# Patient Record
Sex: Female | Born: 1980 | Race: White | Hispanic: No | Marital: Single | Smoking: Current every day smoker
Health system: Southern US, Community
[De-identification: ages and names within clinical notes are randomized; demographics above are authoritative.]

---

## 2014-05-02 ENCOUNTER — Emergency Department (HOSPITAL_COMMUNITY)
Admission: EM | Admit: 2014-05-02 | Discharge: 2014-05-02 | Disposition: A | Discharge status: Home / Self Care | Payer: Self-pay | Attending: Emergency Medicine | Admitting: Emergency Medicine

## 2014-05-02 DIAGNOSIS — N898 Other specified noninflammatory disorders of vagina: Secondary | ICD-10-CM | POA: Insufficient documentation

## 2014-05-02 DIAGNOSIS — Z79899 Other long term (current) drug therapy: Secondary | ICD-10-CM | POA: Insufficient documentation

## 2014-05-02 DIAGNOSIS — R51 Headache: Secondary | ICD-10-CM | POA: Insufficient documentation

## 2014-05-02 DIAGNOSIS — Z3202 Encounter for pregnancy test, result negative: Secondary | ICD-10-CM | POA: Insufficient documentation

## 2014-05-02 DIAGNOSIS — Q613 Polycystic kidney, unspecified: Secondary | ICD-10-CM | POA: Insufficient documentation

## 2014-05-02 DIAGNOSIS — N390 Urinary tract infection, site not specified: Secondary | ICD-10-CM | POA: Insufficient documentation

## 2014-05-02 DIAGNOSIS — Z792 Long term (current) use of antibiotics: Secondary | ICD-10-CM | POA: Insufficient documentation

## 2014-05-02 LAB — URINALYSIS, ROUTINE W REFLEX MICROSCOPIC
Bilirubin Urine: NEGATIVE
Glucose, UA: NEGATIVE mg/dL
KETONES UR: NEGATIVE mg/dL
NITRITE: POSITIVE — AB
PROTEIN: NEGATIVE mg/dL
Specific Gravity, Urine: 1.021 (ref 1.005–1.030)
UROBILINOGEN UA: 0.2 mg/dL (ref 0.0–1.0)
pH: 6 (ref 5.0–8.0)

## 2014-05-02 LAB — CBC WITH DIFFERENTIAL/PLATELET
Basophils Absolute: 0 10*3/uL (ref 0.0–0.1)
Basophils Relative: 0 % (ref 0–1)
Eosinophils Absolute: 0.2 10*3/uL (ref 0.0–0.7)
Eosinophils Relative: 2 % (ref 0–5)
HCT: 36 % (ref 36.0–46.0)
Hemoglobin: 12.3 g/dL (ref 12.0–15.0)
Lymphocytes Relative: 22 % (ref 12–46)
Lymphs Abs: 2 10*3/uL (ref 0.7–4.0)
MCH: 31.1 pg (ref 26.0–34.0)
MCHC: 34.2 g/dL (ref 30.0–36.0)
MCV: 90.9 fL (ref 78.0–100.0)
Monocytes Absolute: 0.5 10*3/uL (ref 0.1–1.0)
Monocytes Relative: 6 % (ref 3–12)
Neutro Abs: 6.3 10*3/uL (ref 1.7–7.7)
Neutrophils Relative %: 70 % (ref 43–77)
Platelets: 284 10*3/uL (ref 150–400)
RBC: 3.96 MIL/uL (ref 3.87–5.11)
RDW: 13 % (ref 11.5–15.5)
WBC: 9.1 10*3/uL (ref 4.0–10.5)

## 2014-05-02 LAB — COMPREHENSIVE METABOLIC PANEL
ALT: 9 U/L (ref 0–35)
AST: 13 U/L (ref 0–37)
Albumin: 3.7 g/dL (ref 3.5–5.2)
Alkaline Phosphatase: 58 U/L (ref 39–117)
BUN: 9 mg/dL (ref 6–23)
CO2: 26 mEq/L (ref 19–32)
Calcium: 9.4 mg/dL (ref 8.4–10.5)
Chloride: 103 mEq/L (ref 96–112)
Creatinine, Ser: 0.69 mg/dL (ref 0.50–1.10)
GFR calc Af Amer: >90 mL/min (ref >90)
GFR calc non Af Amer: >90 mL/min (ref >90)
Glucose, Bld: 82 mg/dL (ref 70–99)
Potassium: 3.8 mEq/L (ref 3.7–5.3)
Sodium: 141 mEq/L (ref 137–147)
Total Bilirubin: <0.2 mg/dL — ABNORMAL LOW (ref 0.3–1.2)
Total Protein: 6.5 g/dL (ref 6.0–8.3)

## 2014-05-02 LAB — URINE MICROSCOPIC-ADD ON

## 2014-05-02 LAB — POC URINE PREG, ED: Preg Test, Ur: NEGATIVE

## 2014-05-02 MED ORDER — OXYCODONE-ACETAMINOPHEN 5-325 MG PO TABS: 2.0000 | ORAL_TABLET | Freq: Once | ORAL | Status: DC

## 2014-05-02 MED ORDER — OXYCODONE-ACETAMINOPHEN 5-325 MG PO TABS
1.0000 | ORAL_TABLET | Freq: Once | ORAL | Status: AC
  Administered 2014-05-02: 1 via ORAL
  Filled 2014-05-02: qty 1

## 2014-05-02 MED ORDER — IBUPROFEN 600 MG PO TABS: 600.0000 mg | ORAL_TABLET | Freq: Four times a day (QID) | ORAL | Status: DC | PRN

## 2014-05-02 MED ORDER — CIPROFLOXACIN HCL 500 MG PO TABS: 500.0000 mg | ORAL_TABLET | Freq: Two times a day (BID) | ORAL | Status: DC

## 2014-05-02 MED ORDER — OXYCODONE-ACETAMINOPHEN 5-325 MG PO TABS: ORAL_TABLET | ORAL | Status: DC

## 2014-05-02 NOTE — ED Notes (Signed)
Pt with hx of PKD. Pt reports UTI and blood in urine.

## 2014-05-02 NOTE — Discharge Planning (Signed)
[facility] Community Liaison  Spoke to patient about community and primary care resources. Patient states she is new to the area and has no insurance at this time. Patient was given the orange card application and instructions on where to take the completed application. My contact information was provided for any future questions or concerns. No other needs expressed at this time.

## 2014-05-02 NOTE — ED Notes (Signed)
Pt called out requesting pain medicine 

## 2014-05-02 NOTE — ED Provider Notes (Signed)
CSN: [id number]     Arrival date & time 05/02/14  1409 History   First MD Initiated Contact with Patient 05/02/14 1430    This chart was scribed for non-physician practitioner, Junius FinnerErin O'Malley, working with Glynn OctaveStephen Rancour, MD by Marica OtterNusrat Rahman, ED Scribe. This patient was seen in room TR10C/TR10C and the patient's care was started at 3:32 PM.  Chief Complaint  Patient presents with  . Urinary Tract Infection   The history is provided by the patient. No language interpreter was used.   HPI Comments: Melanie Braun is a 33 y.o. female, with a Hx of PKD and UTIs (approximately once a month), who presents to the Emergency Department complaining of blood in her urine and associated dysuria. Pt also complains of associated: acute, stabbing left flank pain which she rates a 8 out of 10; acute, burning bladder pain, which she rates a 9 out of 10; elevated BP; and HA. Specifically, pt reports a couple of weeks ago she was very ill with fever, nausea and vomiting, which has now resolved. However, pt notes that since then her BP has been elevated although pt is unable to give specific readings. Pt's current BP is 112/96; pt notes her BP on average "sits low" and is around 95/55. Pt reports her last menstrual cycle was last week. Pt denies vaginal discharge. Pt reports historically 7 day Cipro has been effective combating her UTI Sx. Pt denies any present nausea, vomiting, fever.    No past medical history on file. No past surgical history on file. No family history on file. History  Substance Use Topics  . Smoking status: Not on file  . Smokeless tobacco: Not on file  . Alcohol Use: Not on file   OB History   No data available     Review of Systems  Constitutional: Negative for fever.  Gastrointestinal: Negative for nausea and vomiting.  Genitourinary: Positive for dysuria, flank pain and vaginal bleeding.  Musculoskeletal:          Neurological: Positive for headaches.  All other systems  reviewed and are negative.     Allergies  Bactrim and Bee venom  Home Medications   Prior to Admission medications   Medication Sig Start Date End Date Taking? Authorizing Provider  CRANBERRY PO Take 1 tablet by mouth every evening.   Yes Historical Provider, MD  EPINEPHrine (EPIPEN) 0.3 mg/0.3 mL IJ SOAJ injection Inject 0.3 mg into the muscle as needed (allergic reaction).   Yes Historical Provider, MD  Multiple Vitamin (MULTIVITAMIN WITH MINERALS) TABS tablet Take 1 tablet by mouth every evening.   Yes Historical Provider, MD  OVER THE COUNTER MEDICATION Take 1 capsule by mouth every evening. Kidney k   Yes Historical Provider, MD  ciprofloxacin (CIPRO) 500 MG tablet Take 1 tablet (500 mg total) by mouth 2 (two) times daily. 05/02/14   Junius FinnerErin O'Malley, PA-C  ibuprofen (ADVIL,MOTRIN) 600 MG tablet Take 1 tablet (600 mg total) by mouth every 6 (six) hours as needed. 05/02/14   Junius FinnerErin O'Malley, PA-C  oxyCODONE-acetaminophen (PERCOCET/ROXICET) 5-325 MG per tablet Take 1-2 pills every 4-6 hours as needed for pain. 05/02/14   Junius FinnerErin O'Malley, PA-C   Triage Vitals: BP 112/96  Pulse 90  Temp(Src) 97.8 F (36.6 C) (Oral)  SpO2 100% Physical Exam  Nursing note and vitals reviewed. Constitutional: She is oriented to person, place, and time. She appears well-developed and well-nourished.  Appears normal and non-toxic   HENT:  Head: Normocephalic and atraumatic.  Eyes: EOM are  normal.  Neck: Normal range of motion. Neck supple.  Cardiovascular: Normal rate, regular rhythm and normal heart sounds.   Pulmonary/Chest: Effort normal and breath sounds normal. No respiratory distress. She has no wheezes. She has no rales. She exhibits no tenderness.  Abdominal: Soft. She exhibits no distension and no mass. There is no tenderness. There is no rebound and no guarding.  Left CVA mild tenderness. Soft, non-distended, non-tender.  Musculoskeletal: Normal range of motion. She exhibits no edema.  Neurological:  She is alert and oriented to person, place, and time.  Skin: Skin is warm and dry.  Psychiatric: She has a normal mood and affect. Her behavior is normal.    ED Course  Procedures (including critical care time) DIAGNOSTIC STUDIES: Oxygen Saturation is 100% on ra, normal by my interpretation.    COORDINATION OF CARE: 3:37 PM-Discussed treatment plan which includes urology referral, with pt at bedside and pt agreed to plan.   Labs Review Labs Reviewed  URINALYSIS, ROUTINE W REFLEX MICROSCOPIC - Abnormal; Notable for the following:    APPearance CLOUDY (*)    Hgb urine dipstick TRACE (*)    Nitrite POSITIVE (*)    Leukocytes, UA SMALL (*)    All other components within normal limits  COMPREHENSIVE METABOLIC PANEL - Abnormal; Notable for the following:    Total Bilirubin <0.2 (*)    All other components within normal limits  URINE MICROSCOPIC-ADD ON - Abnormal; Notable for the following:    Squamous Epithelial / LPF FEW (*)    Bacteria, UA MANY (*)    All other components within normal limits  CBC WITH DIFFERENTIAL  POC URINE PREG, ED    Imaging Review No results found.   EKG Interpretation None      MDM   Final diagnoses:  UTI (lower urinary tract infection)    Pt with hx of PKD and recurrent UTIs presenting with concern for UTI. Pt also reports elevated BP, however Vitals: WNL, BP-112/96 in triage.  Pt appears well, non-toxic. UA: consistent for UTI.  Will tx.  CBC and CMP-unremarkable.  Advised to f/u with PCP and Dr. Mena Goes, urology as needed for ongoing healthcare needs and recurrent UTIs. Return precautions provided. Pt verbalized understanding and agreement with tx plan.   I personally performed the services described in this documentation, which was scribed in my presence. The recorded information has been reviewed and is accurate.    Junius Finner, PA-C 05/02/14 1651

## 2014-05-02 NOTE — ED Notes (Signed)
Called lab about status of blood work.  Per lab tech, blood not received.  Spoke with supervisor who stated blood was received but not in process.  Blood work currently in process.

## 2014-05-03 NOTE — ED Provider Notes (Signed)
Medical screening examination/treatment/procedure(s) were performed by non-physician practitioner and as supervising physician I was immediately available for consultation/collaboration.   EKG Interpretation None        Raylie Maddison, MD 05/03/14 0148 

## 2014-06-24 ENCOUNTER — Emergency Department (HOSPITAL_COMMUNITY): Payer: Self-pay

## 2014-06-24 ENCOUNTER — Emergency Department (HOSPITAL_COMMUNITY)
Admission: EM | Admit: 2014-06-24 | Discharge: 2014-06-24 | Disposition: A | Discharge status: Home / Self Care | Payer: Self-pay | Attending: Emergency Medicine | Admitting: Emergency Medicine

## 2014-06-24 ENCOUNTER — Encounter (HOSPITAL_COMMUNITY): Payer: Self-pay | Admitting: Emergency Medicine

## 2014-06-24 DIAGNOSIS — F17200 Nicotine dependence, unspecified, uncomplicated: Secondary | ICD-10-CM | POA: Insufficient documentation

## 2014-06-24 DIAGNOSIS — S1093XA Contusion of unspecified part of neck, initial encounter: Principal | ICD-10-CM

## 2014-06-24 DIAGNOSIS — S20229A Contusion of unspecified back wall of thorax, initial encounter: Secondary | ICD-10-CM | POA: Insufficient documentation

## 2014-06-24 DIAGNOSIS — S300XXA Contusion of lower back and pelvis, initial encounter: Secondary | ICD-10-CM | POA: Insufficient documentation

## 2014-06-24 DIAGNOSIS — Z791 Long term (current) use of non-steroidal anti-inflammatories (NSAID): Secondary | ICD-10-CM | POA: Insufficient documentation

## 2014-06-24 DIAGNOSIS — R51 Headache: Secondary | ICD-10-CM

## 2014-06-24 DIAGNOSIS — S0003XA Contusion of scalp, initial encounter: Secondary | ICD-10-CM | POA: Insufficient documentation

## 2014-06-24 DIAGNOSIS — R519 Headache, unspecified: Secondary | ICD-10-CM

## 2014-06-24 DIAGNOSIS — S0083XA Contusion of other part of head, initial encounter: Secondary | ICD-10-CM | POA: Insufficient documentation

## 2014-06-24 DIAGNOSIS — S0990XA Unspecified injury of head, initial encounter: Secondary | ICD-10-CM | POA: Insufficient documentation

## 2014-06-24 MED ORDER — TETANUS-DIPHTH-ACELL PERTUSSIS 5-2.5-18.5 LF-MCG/0.5 IM SUSP
0.5000 mL | Freq: Once | INTRAMUSCULAR | Status: DC
  Filled 2014-06-24: qty 0.5

## 2014-06-24 MED ORDER — KETOROLAC TROMETHAMINE 60 MG/2ML IM SOLN
60.0000 mg | Freq: Once | INTRAMUSCULAR | Status: AC
  Administered 2014-06-24: 60 mg via INTRAMUSCULAR
  Filled 2014-06-24: qty 2

## 2014-06-24 MED ORDER — HYDROCODONE-ACETAMINOPHEN 5-325 MG PO TABS
2.0000 | ORAL_TABLET | ORAL | Status: AC
  Administered 2014-06-24: 2 via ORAL
  Filled 2014-06-24: qty 2

## 2014-06-24 MED ORDER — HYDROCODONE-ACETAMINOPHEN 5-325 MG PO TABS: 2.0000 | ORAL_TABLET | ORAL | Status: AC | PRN

## 2014-06-24 MED ORDER — NAPROXEN 500 MG PO TABS: 500.0000 mg | ORAL_TABLET | Freq: Two times a day (BID) | ORAL | Status: AC
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
## 2014-06-24 NOTE — Discharge Instructions (Signed)
Your xrays show no fractures or brain injury - ice packs, pain medicine as prescribed, see attached follow up list.   Emergency Department Resource Guide 1) Find a Doctor and Pay Out of Pocket Although you won't have to find out who is covered by your insurance plan, it is a good idea to ask around and get recommendations. You will then need to call the office and see if the doctor you have chosen will accept you as a new patient and what types of options they offer for patients who are self-pay. Some doctors offer discounts or will set up payment plans for their patients who do not have insurance, but you will need to ask so you aren't surprised when you get to your appointment.  2) Contact Your Local Health Department Not all health departments have doctors that can see patients for sick visits, but many do, so it is worth a call to see if yours does. If you don't know where your local health department is, you can check in your phone book. The CDC also has a tool to help you locate your state's health department, and many state websites also have listings of all of their local health departments.  3) Find a Walk-in Clinic If your illness is not likely to be very severe or complicated, you may want to try a walk in clinic. These are popping up all over the country in pharmacies, drugstores, and shopping centers. They're usually staffed by nurse practitioners or physician assistants that have been trained to treat common illnesses and complaints. They're usually fairly quick and inexpensive. However, if you have serious medical issues or chronic medical problems, these are probably not your best option.  No Primary Care Doctor: - Call Health Connect at  [phone] - they can help you locate a primary care doctor that  accepts your insurance, provides certain services, etc. - Physician Referral Service- [phone]  Chronic Pain Problems: Organization         Address  Phone   Notes  [facility]  [phone]) [phone] Patients need to be referred by their primary care doctor.   Medication Assistance: Organization         Address  Phone   Notes  [facility]ication [facility] [address] [place], [place] [address] [phone]) [phone] --Must be a resident of [facility] [place] -- Must have NO insurance coverage whatsoever (no Medicaid/ Medicare, etc.) -- The pt. MUST have a primary care doctor that directs their care regularly and follows them in the community   MedAssist  [phone]   [employer]  [phone]    Agencies that provide inexpensive medical care: Organization         Address  Phone   Notes  [facility]  [phone]   [facility]    [phone]) [phone]   [facility] [address] [place], [place] [address] [phone]   [facility] [address], [place] [phone]   Planned Parenthood    [phone]   [facility]    [phone]   [facility] and [facility]  [address], [place] Phone:  [phone](336) [phone], Fax:  [phone] Hours of Operation:  9 am - 6 pm, M-F.  Also accepts Medicaid/Medicare and self-pay.  [facility]  [address], [place] Phone: [phone], Fax: [phone])  [phone]. Hours of Operation:  8:30 am - 5:30 pm, M-F.  Also accepts Medicaid and self-pay.  [facility] [place] [address], [place] Phone: [phone]) [phone]   [facility] [address], [place], [place] [phone], Ext. [phone] Mondays & Thursdays: 7-9 AM.  First 15 patients are seen on a first come, first serve basis.    Medicaid-accepting [facility] Providers:  Organization         Address  Phone   Notes  [facility] [address], [place] [phone] Also accepts self-pay patients.  [facility] [address] [place], [place]  [phone]   [facility] [address], [place] [phone]) [phone]   [facility] [address] [place] [address], [place] [phone]) [phone]   Renaye RakersVeita Bland [address], [place]   [phone]) [phone] Only accepts [place] Access [place]icaid patients after they have their name applied to their card.   Self-Pay (no insurance) in [facility] [place]:  Organization         Address  Phone   Notes  Sickle Cell Patients, [facility] [address], [place] [phone]) [phone]   [facility] [address], [place] [phone]   [facility]  [address], [place] [phone]   [facility]/Dr. Osei-Bonsu  [address] [place] [address], [place] or [id number], [place] [phone] Phone number for both [place] and [place] locations is the same.  [facility]gent [facility] and [facility] [address], [place] [phone]) [phone]   [facility] [place] [address] [place] [address], [place] or [address] [phone]) [phone]) [phone]   [facility] [address], [place] [phone]) [phone], phone; [phone](336) [phone], fax Sees patients 1st and 3rd Saturday of every month.  Must not qualify for public or private insurance (i.e. Medicaid, Medicare, [place] Health Choice, Veterans' Benefits)  Household income should be no more than 200% of the poverty level The clinic cannot treat you if you are pregnant or think you are pregnant  Sexually transmitted diseases are not treated at the clinic.    Dental Care: Organization         Address  Phone  Notes  [facility] [address], [place] [phone]) [phone] Accepts children up to age 31 who are enrolled in [place]icaid or [place] Health Choice; pregnant women with a Medicaid card; and children who have applied for Medicaid  or [place] Health Choice, but were declined, whose parents can pay a reduced fee at time of service.  [facility] of [facility] [place]  [address], [place] [phone] Accepts children up to age 31 who are enrolled in [place]icaid or [place] Health Choice; pregnant women with a Medicaid card; and children who have applied for Medicaid or [place] Health Choice, but were declined, whose parents can pay a reduced fee at time of service.  [facility]  [address], [place] [phone] Patients are seen by appointment only. Walk-ins are not accepted. [facility] will see patients 10518 years of age and older. Monday - Tuesday (8am-5pm) Most Wednesdays (8:30-5pm) $30 per visit, cash only  [facility] [employer] GroupDental Access PROGRAM  [address], [place] [phone]) [phone]  Patients are seen by appointment only. Walk-ins are not accepted. [facility] will see patients 31 years of age and older. One Wednesday Evening (Monthly: Volunteer Based).  $30 per visit, cash only  [employer] [facility]  [phone]) [phone] for adults; Children under age 744, call Graduate Pediatric Dentistry at [phone]) [phone]. Children aged 584-14, please call [phone]) [phone] to request a pediatric application.  Dental services are provided in all areas of dental care including fillings, crowns and bridges, complete and partial dentures, implants, gum treatment, root canals, and extractions. Preventive care is also provided. Treatment is provided to both adults and children. Patients are selected via a lottery and there is often a waiting list.   [facility] [address], [place]  [phone]) [phone] [email or web address]   [facility] [address], [place], [place] [phone], Ext. [phone] Second and Fourth Thursday of each month, opens at 6:30 AM; Clinic ends at 9 AM.  Patients are seen on a first-come first-served basis, and a limited number are seen  during each clinic.   [facility]  [address], [place], [place] [phone]) [phone]   Eligibility Requirements You must have lived in [place], [place], or [place] counties for at least the last three months.   You cannot be eligible for state or federal sponsored National Cityhealthcare insurance, including CIGNAVeterans Administration, [place]icaid, or [employer] EntertainmentMedicare.   You generally cannot be eligible for healthcare insurance through your employer.    How to apply: Eligibility screenings are held every Tuesday and Wednesday afternoon from 1:00 pm until 4:00 pm. You do not need an appointment for the interview!  [facility] [address], [place], [place] [phone]   [facility]  [phone]   [facility]  [phone]   [facility]  [phone]    Behavioral Health Resources in the Community: Intensive Outpatient Programs Organization         Address  Phone  Notes  [facility] [address], [place], [place] [phone]   [facility] [address], [place], [place] [phone]   [facility] [address], [place], [place]  [phone]   [facility] [address],  [place], [place] [phone] or [phone]   Substance Abuse Resources Organization         Address  Phone  Notes  [facility] Drug Services  [phone]   [facility]  [phone]   [facility]  [phone]   [facility]  [phone]   Residential & Outpatient Substance Abuse Program  [phone]   Psychological Services Organization         Address  Phone  Notes  [facility]  [phone]- [phone]   [facility]  [phone]- [phone]   [facility] [address], [place] [phone] or [phone]    Mobile Crisis Teams Organization         Address  Phone  Notes  [facility],  Mobile Crisis Care Unit  [phone]   [facility]  [address] [place], [place] [phone]   Doristine LocksSharon DeEsch [address] [place] [phone]    Self-Help/Support Groups Organization         Address  Phone             Notes  [facility] - variety of support groups  [phone]- [phone]  Call for more information  Narcotics Anonymous (NA), Caring [facility] [address], [place]  2 meetings at this location   Residential Sports administrator         Address  Phone  Notes  [facility] [address],    [place]  [phone]   [facility]  [address], [place] [address], [place], [place] [phone]   [facility] [address] [place], [place] [phone] Admissions: 8am-3pm M-F  [facility] [address],    [place], [place] [phone]   [facility] [address] [place], [place], [place] [phone]   [facility] [address]  [place], [place] [phone]   [facility] - Intensive Outpatient [address], [place], [place] [phone]   [facility] [address] [place]  [place], [place] [phone] or [phone]   [facility] ([facility]) [address] [place], [place] [phone] Accepts Medicaid  [facility] [place] [address],  [place] [phone] Substance Abuse/Addiction Treatment   [facility] Organization         Address  Phone  Notes  [facility]  [phone]   Angie Fava, PhD [address] [place], [place]   [phone] or [phone]   [facility]   [address] [place], [place] [phone]   [facility] [address], [place], [place] [phone] Insurance/Medicaid/sponsorship through [facility] and Families [address],  [address]                                    [place], [place] [phone] Therapy/tele-psych/case  [facility] [address], [place] [phone]    Dr. Lolly Mustache  [phone]   [facility]  [employer] [place] Dept. 1) [address] [place] 2) [address], [place] 3)  [address], [place] [phone]  [phone]   [facility] Child Abuse Hotline [phone] or [phone] (After Hours)
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
